# Patient Record
Sex: Male | Born: 1995 | Race: Black or African American | Hispanic: No | Marital: Single | Smoking: Never smoker
Health system: Southern US, Community
[De-identification: ages and names within clinical notes are randomized; demographics above are authoritative.]

---
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
## 2016-01-20 IMAGING — CR DG SHOULDER 2+V*R*
3 series · 3 of 3 positions shown · non-contrast
Comparison: None.

CLINICAL DATA: Shoulder pain. RIGHT posterior shoulder pain since
yesterday.

EXAM:
RIGHT SHOULDER - 2+ VIEW

[w shoulder external right]
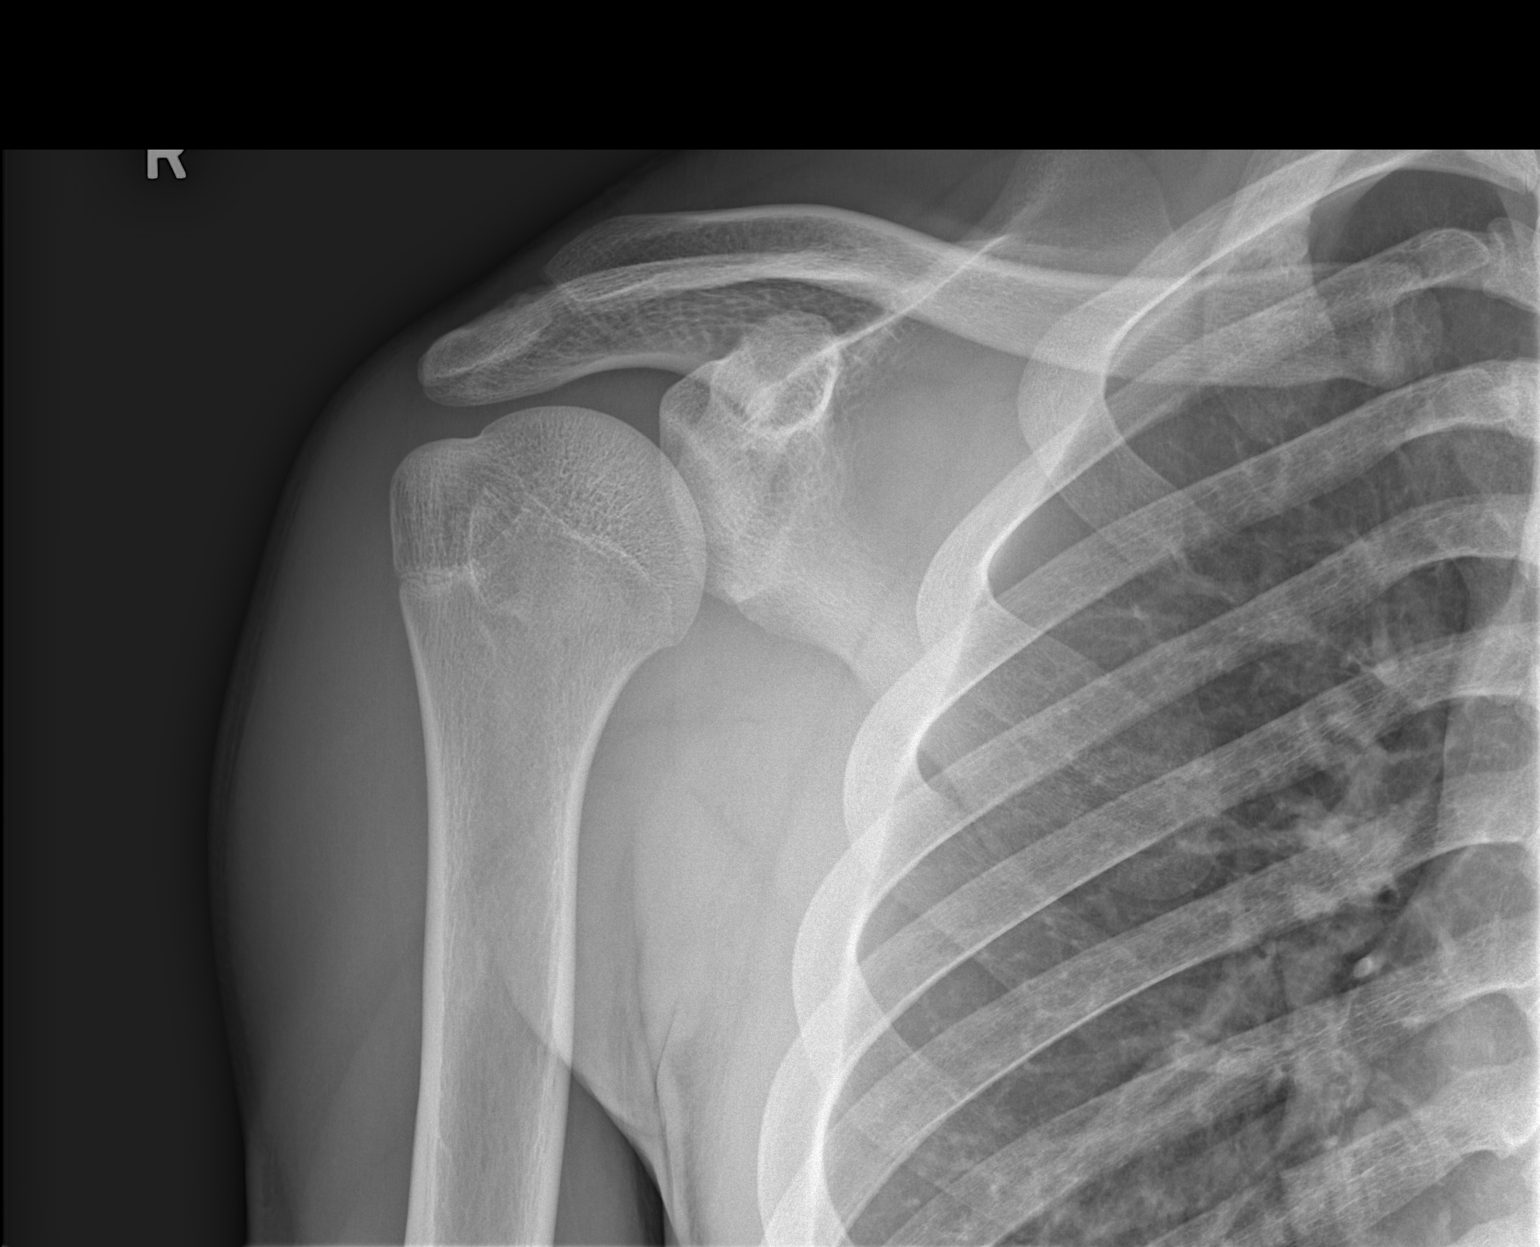

[w shoulder y-view right]
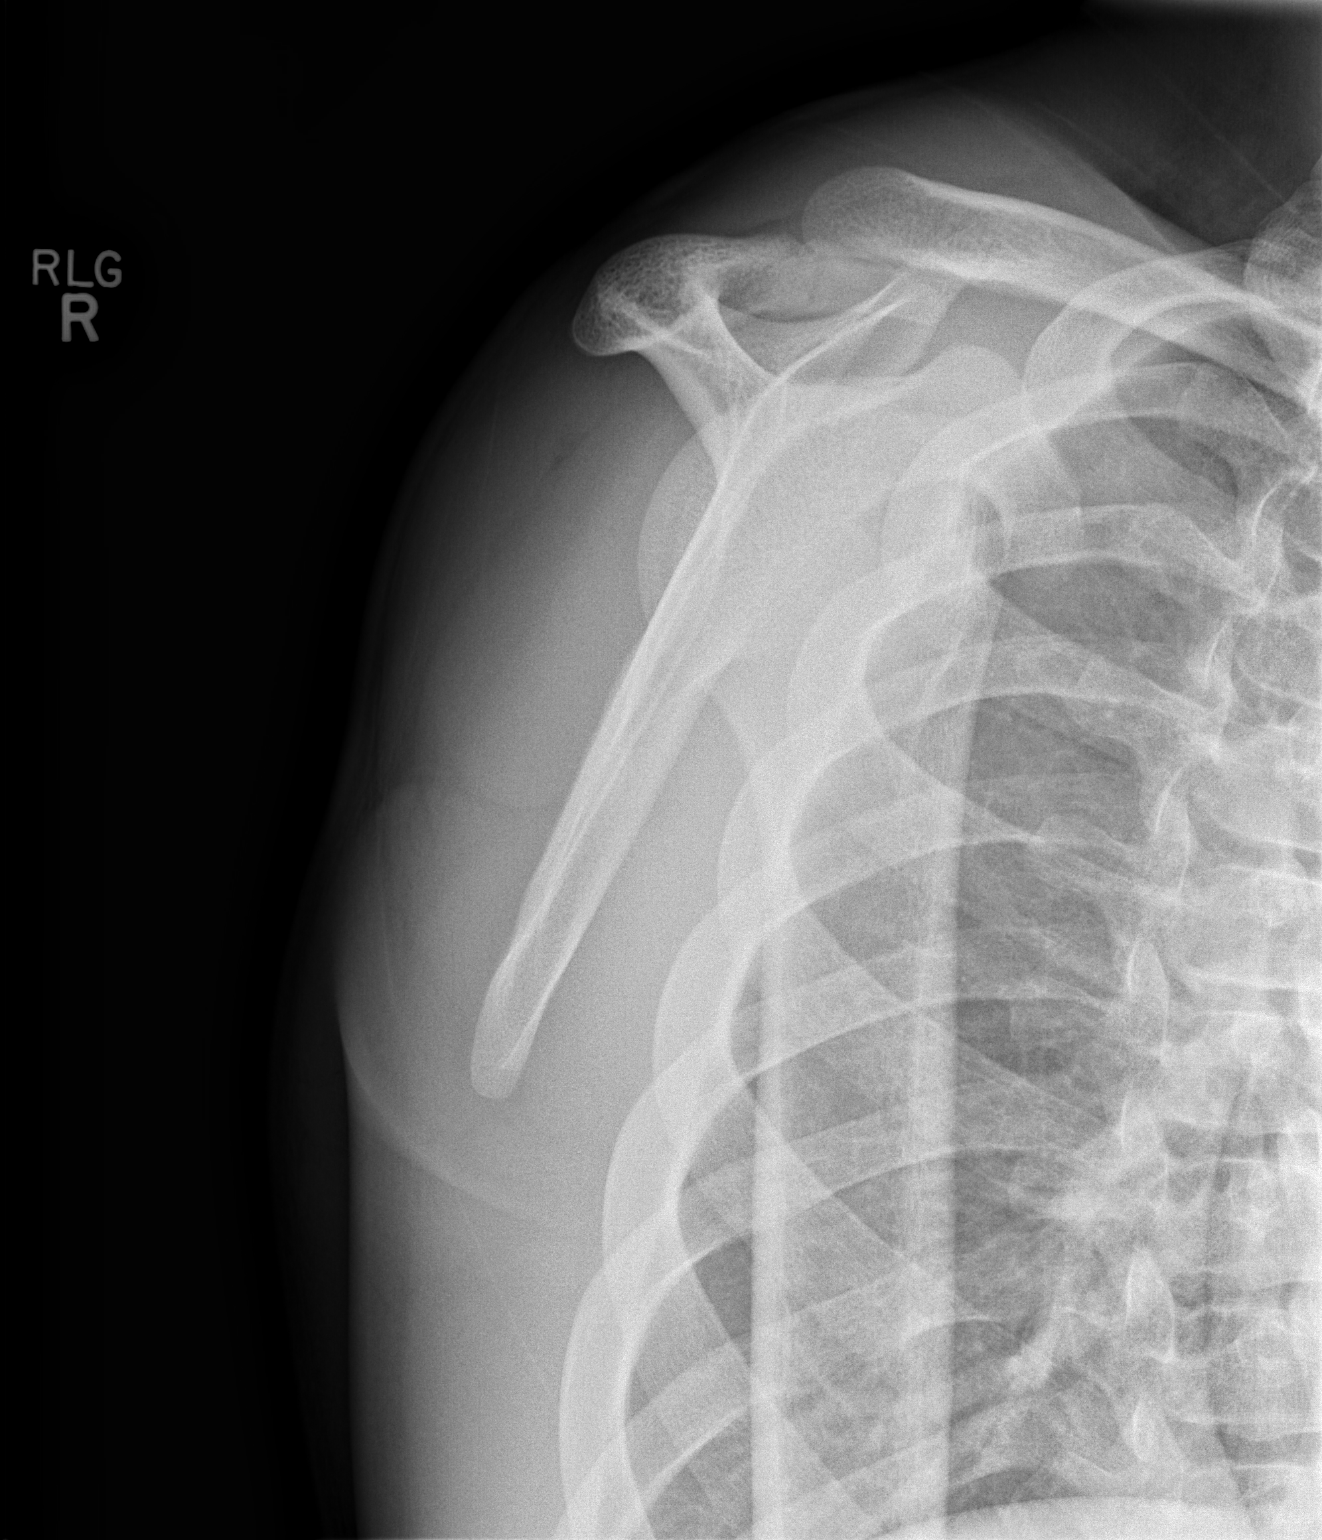

[x shoulder axillary right]
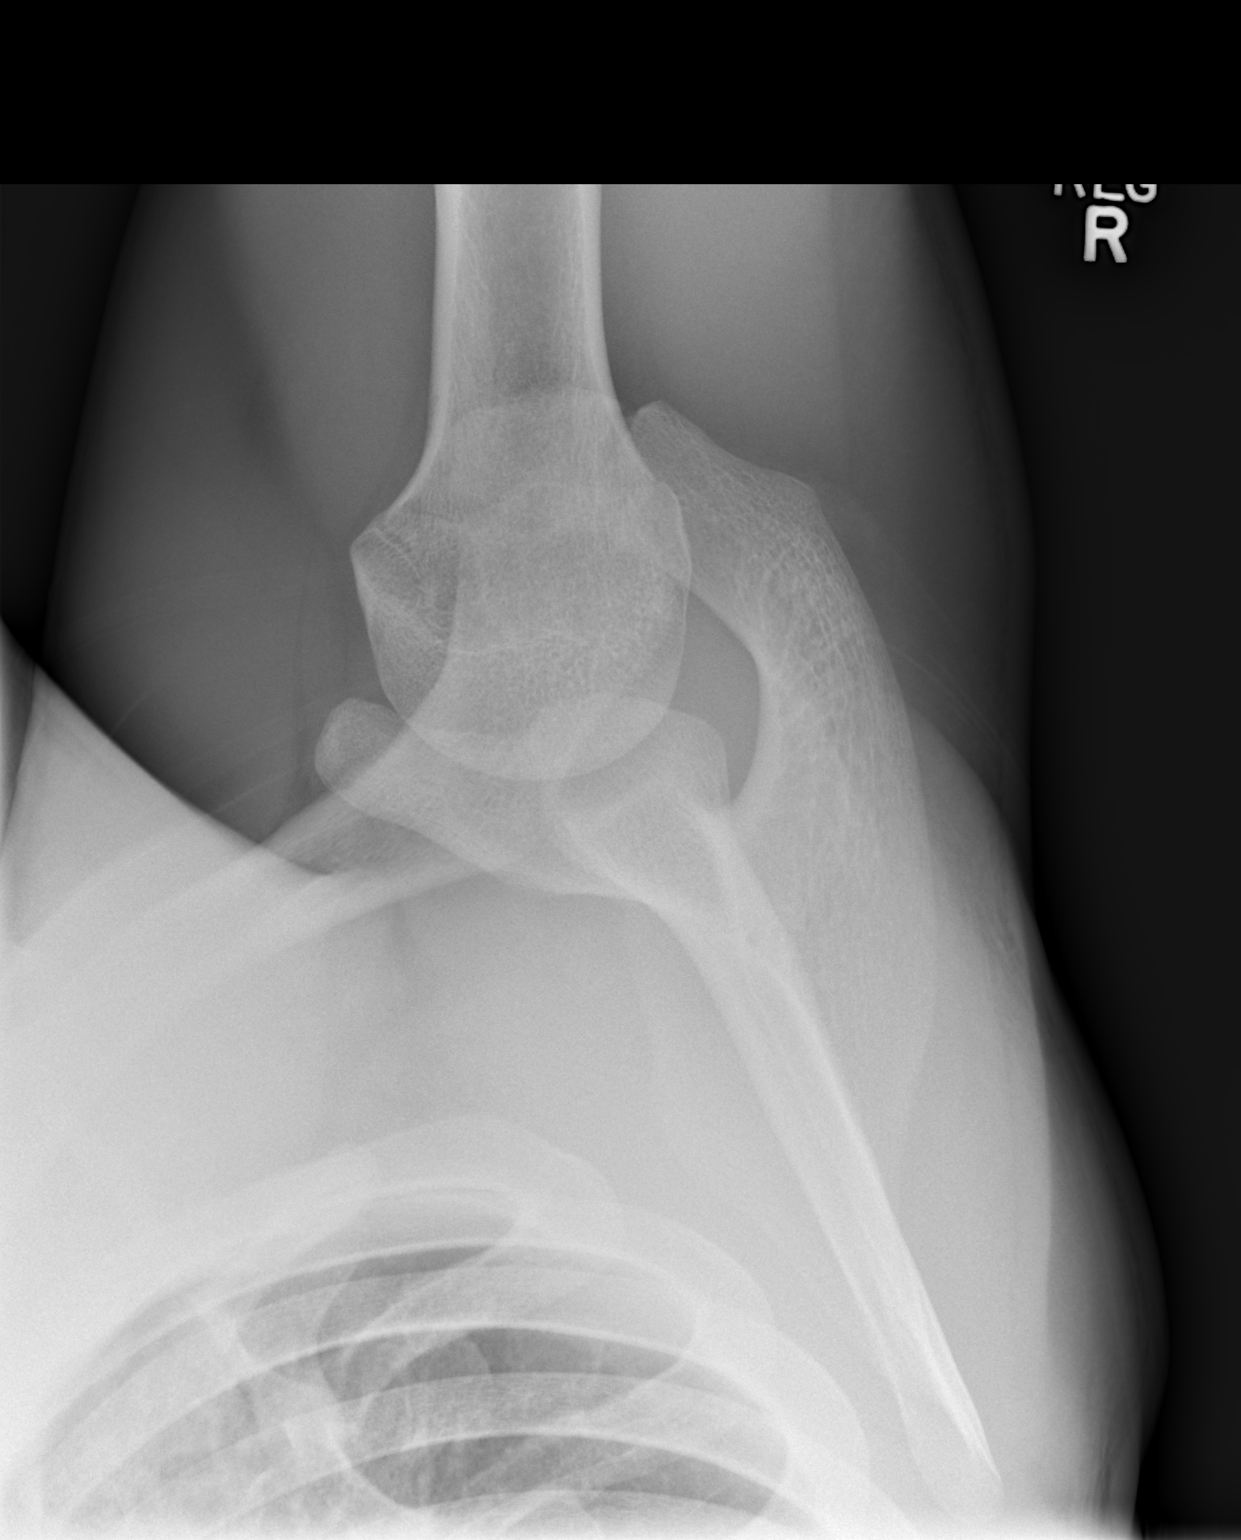

[3 of 3 positions shown; findings below may reference images not displayed]

FINDINGS: There is no evidence of fracture or dislocation. There is no
evidence of arthropathy or other focal bone abnormality. Soft
tissues are unremarkable. Unfused acromial apophysis is noted,
normal for age.
IMPRESSION: Negative.
